# Patient Record
Sex: Male | Born: 1985 | Race: Black or African American | Hispanic: No | Marital: Single | Smoking: Current every day smoker
Health system: Southern US, Community
[De-identification: ages and names within clinical notes are randomized; demographics above are authoritative.]

## PROBLEM LIST (undated history)

## (undated) DIAGNOSIS — M654 Radial styloid tenosynovitis [de Quervain]: Secondary | ICD-10-CM

## (undated) HISTORY — DX: Radial styloid tenosynovitis (de quervain): M65.4

---
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
## 2005-04-08 ENCOUNTER — Emergency Department (HOSPITAL_COMMUNITY): Admission: EM | Admit: 2005-04-08 | Discharge: 2005-04-08 | Payer: Self-pay | Admitting: Emergency Medicine

## 2007-05-03 ENCOUNTER — Emergency Department (HOSPITAL_COMMUNITY): Admission: EM | Admit: 2007-05-03 | Discharge: 2007-05-03 | Payer: Self-pay | Admitting: Family Medicine

## 2007-11-02 HISTORY — PX: ORIF WRIST FRACTURE: SHX2133

## 2008-04-22 ENCOUNTER — Emergency Department (HOSPITAL_COMMUNITY): Admission: EM | Admit: 2008-04-22 | Discharge: 2008-04-22 | Payer: Self-pay | Admitting: Family Medicine

## 2008-04-24 ENCOUNTER — Emergency Department (HOSPITAL_COMMUNITY): Admission: EM | Admit: 2008-04-24 | Discharge: 2008-04-24 | Payer: Self-pay | Admitting: Family Medicine

## 2008-10-01 ENCOUNTER — Emergency Department (HOSPITAL_COMMUNITY): Admission: EM | Admit: 2008-10-01 | Discharge: 2008-10-01 | Payer: Self-pay | Admitting: Family Medicine

## 2009-01-28 ENCOUNTER — Emergency Department (HOSPITAL_COMMUNITY): Admission: EM | Admit: 2009-01-28 | Discharge: 2009-01-28 | Payer: Self-pay | Admitting: Family Medicine

## 2009-02-01 ENCOUNTER — Emergency Department (HOSPITAL_COMMUNITY): Admission: EM | Admit: 2009-02-01 | Discharge: 2009-02-02 | Payer: Self-pay | Admitting: Emergency Medicine

## 2009-02-04 ENCOUNTER — Encounter: Admission: RE | Admit: 2009-02-04 | Discharge: 2009-02-04 | Payer: Self-pay | Admitting: *Deleted

## 2009-02-05 ENCOUNTER — Ambulatory Visit (HOSPITAL_BASED_OUTPATIENT_CLINIC_OR_DEPARTMENT_OTHER): Admission: RE | Admit: 2009-02-05 | Discharge: 2009-02-05 | Payer: Self-pay | Admitting: *Deleted

## 2009-04-17 ENCOUNTER — Emergency Department (HOSPITAL_COMMUNITY): Admission: EM | Admit: 2009-04-17 | Discharge: 2009-04-18 | Payer: Self-pay | Admitting: Emergency Medicine

## 2009-11-18 ENCOUNTER — Emergency Department (HOSPITAL_COMMUNITY): Admission: EM | Admit: 2009-11-18 | Discharge: 2009-11-18 | Payer: Self-pay | Admitting: Emergency Medicine

## 2011-02-10 LAB — POCT HEMOGLOBIN-HEMACUE: Hemoglobin: 14.9 g/dL (ref 13.0–17.0)

## 2011-03-16 NOTE — Op Note (Signed)
NAME:  Maurice Welch, Maurice Welch                ACCOUNT NO.:  [id number]   MEDICAL RECORD NO.:  [id number]          PATIENT TYPE:  AMB   LOCATION:  DSC                          FACILITY:  [facility]   PHYSICIAN:  Tennis Must Meyerdierks, M.D.DATE OF BIRTH:  [birth year]   DATE OF PROCEDURE:  02/05/2009  DATE OF DISCHARGE:                               OPERATIVE REPORT   PREOPERATIVE DIAGNOSIS:  Fracture-dislocation, right fourth and fifth  carpometacarpal joints.   POSTOPERATIVE DIAGNOSIS:  Fracture-dislocation, right fourth and fifth  carpometacarpal joints.   PROCEDURE:  Closed reduction and percutaneous pinning, right fourth and  fifth carpometacarpal joints.   SURGEON:  Lowell Bouton, MD   ANESTHESIA:  General.   OPERATIVE FINDINGS:  The patient had an unstable fracture of the hamate  and also the fourth metacarpal base.  There was a 30% dorsal subluxation  of the fourth CMC joint and near-complete dislocation of the fifth CMC  joint.  A CT scan had identified this nicely.   PROCEDURE:  Under general anesthesia with a tourniquet on the right arm,  the right hand was prepped and draped in the usual fashion and after  elevating the limb, the tourniquet was inflated to 250 mmHg.  The fourth  and fifth CMC joints were reduced closed, and then a 0.045 K-wire was  passed across the fifth metacarpal into the carpus and then second one  across the fifth to the fourth metacarpal and into the carpus.  X-rays  showed good position and under fluoroscopy, there was no instability.  The pins were bent over and left protruding from the skin.  The patient  was placed in a dorsal protective splint.  He tolerated the procedure  well and went to the recovery room, awake in stable and good condition.      Lowell Bouton, M.D.  Electronically Signed     EMM/MEDQ  D:  02/05/2009  T:  02/06/2009  Job:  [id number]

## 2017-03-12 ENCOUNTER — Emergency Department (HOSPITAL_COMMUNITY)
Admission: EM | Admit: 2017-03-12 | Discharge: 2017-03-12 | Disposition: A | Discharge status: Home / Self Care | Payer: BLUE CROSS/BLUE SHIELD | Attending: Emergency Medicine | Admitting: Emergency Medicine

## 2017-03-12 ENCOUNTER — Emergency Department (HOSPITAL_COMMUNITY): Payer: BLUE CROSS/BLUE SHIELD

## 2017-03-12 ENCOUNTER — Encounter (HOSPITAL_COMMUNITY): Payer: Self-pay | Admitting: Emergency Medicine

## 2017-03-12 DIAGNOSIS — S6991XA Unspecified injury of right wrist, hand and finger(s), initial encounter: Secondary | ICD-10-CM | POA: Diagnosis not present

## 2017-03-12 DIAGNOSIS — Y999 Unspecified external cause status: Secondary | ICD-10-CM | POA: Diagnosis not present

## 2017-03-12 DIAGNOSIS — Y939 Activity, unspecified: Secondary | ICD-10-CM | POA: Insufficient documentation

## 2017-03-12 DIAGNOSIS — F17290 Nicotine dependence, other tobacco product, uncomplicated: Secondary | ICD-10-CM | POA: Diagnosis not present

## 2017-03-12 DIAGNOSIS — W231XXA Caught, crushed, jammed, or pinched between stationary objects, initial encounter: Secondary | ICD-10-CM | POA: Insufficient documentation

## 2017-03-12 DIAGNOSIS — Y929 Unspecified place or not applicable: Secondary | ICD-10-CM | POA: Insufficient documentation

## 2017-03-12 NOTE — ED Notes (Signed)
Pt verbalized understanding discharge instructions and denies any further needs or questions at this time. VS stable, ambulatory and steady gait.   

## 2017-03-12 NOTE — ED Triage Notes (Signed)
Reports jamming right thumb at work today.  Some swelling noted.

## 2017-03-12 NOTE — Discharge Instructions (Signed)
Wear splint on finger as directed. Take ibuprofen or naproxen/Aleve as needed for pain and inflammation. Schedule appointment with Ortho listed below for further evaluation. Return to ED for worsening pain, increased swelling, numbness, additional injury, fever.

## 2017-03-12 NOTE — ED Provider Notes (Signed)
[facility] Provider Note   CSN: [id number] Arrival date & time: 03/12/17  2214  By signing my name below, I, Doreatha Martin, attest that this documentation has been prepared under the direction and in the presence of Malgorzata Albert, PA-C. Electronically Signed: Doreatha Martin, ED Scribe. 03/12/17. 11:42 PM.    History   Chief Complaint Chief Complaint  Patient presents with  . right thumb injury    HPI Maurice Welch is a 31 y.o. male who presents to the Emergency Department complaining of gradually worsening right thumb pain and swelling that began a few days ago and worsened today. Pt states he believes he jammed the thumb a few days ago, causing his pain. He then reports overuse of it today. No known recent bites or wounds. He states his pain is worsened with thumb movement and alleviated with Aleve. He reports h/o fx and surgical repair 5-6 years ago to the right hand, but not to the right thumb. He denies fever, weakness, wrist pain, Numbness, additional complaints.   The history is provided by the patient. No language interpreter was used.    History reviewed. No pertinent past medical history.  There are no active problems to display for this patient.   History reviewed. No pertinent surgical history.     Home Medications    Prior to Admission medications   Not on File    Family History No family history on file.  Social History Social History  Substance Use Topics  . Smoking status: Current Every Day Smoker    Packs/day: 0.20    Years: 10.00    Types: Cigars  . Smokeless tobacco: Never Used  . Alcohol use Yes     Comment: occasionally     Allergies   Patient has no allergy information on record.   Review of Systems Review of Systems  Constitutional: Negative for fever.  Musculoskeletal: Positive for arthralgias and joint swelling.  Neurological: Negative for weakness.  All other systems reviewed and are negative.    Physical Exam Updated  Vital Signs BP 140/82 (BP Location: Right Arm)   Pulse 90   Temp 98.8 F (37.1 C) (Oral)   Resp 17   Ht 6\' 1"  (1.854 m)   Wt 86.2 kg   SpO2 99%   BMI 25.07 kg/m   Physical Exam  Constitutional: He appears well-developed and well-nourished. No distress.  HENT:  Head: Normocephalic and atraumatic.  Eyes: Conjunctivae and EOM are normal. No scleral icterus.  Neck: Normal range of motion.  Pulmonary/Chest: Effort normal. No respiratory distress.  Musculoskeletal:       Right hand: He exhibits swelling.       Hands: There is swelling in the indicated areas extending to the IP joint of the thumb. No redness or temperature change. Pain with movement of the IP joint. FROM at the MCP joint. Area is NVI.   Neurological: He is alert.  Skin: No rash noted. He is not diaphoretic.  Psychiatric: He has a normal mood and affect.  Nursing note and vitals reviewed.    ED Treatments / Results   DIAGNOSTIC STUDIES: Oxygen Saturation is 98% on RA, normal by my interpretation.    COORDINATION OF CARE: 11:41 PM Discussed treatment plan with pt at bedside which includes XR and pt agreed to plan.    Labs (all labs ordered are listed, but only abnormal results are displayed) Labs Reviewed - No data to display  EKG  EKG Interpretation None  Radiology Dg Finger Thumb Right  Result Date: 03/12/2017 CLINICAL DATA:  Painful right thumb for 2 days.  No injury. EXAM: RIGHT THUMB 2+V COMPARISON:  Right hand 02/01/2009 FINDINGS: Mild soft tissue swelling over the first carpometacarpal joint. The joint space is preserved and no bone erosion is demonstrated. No evidence of acute fracture or dislocation. IMPRESSION: Soft tissue swelling.  No acute bony abnormalities. Electronically Signed   By: Burman NievesWilliam  Stevens M.D.   On: 03/12/2017 23:33    Procedures Procedures (including critical care time)  Medications Ordered in ED Medications - No data to display   Initial Impression / Assessment  and Plan / ED Course  I have reviewed the triage vital signs and the nursing notes.  Pertinent labs & imaging results that were available during my care of the patient were reviewed by me and considered in my medical decision making (see chart for details).     Patient X-Ray negative for obvious fracture or dislocation. No signs of neurovascular compromise at this time. No concern for cellulitis as patient is afebrile, nontoxic appearing and appearance consistent with tendon or muscle injury. No site of injury or area of inoculation.  Pt advised to follow up with orthopedics. Patient given finger splint while in ED, and encouraged to take ibuprofen or Aleve as needed for pain and inflammation. Patient will be discharged home & is agreeable with above plan. Strict return precautions given.  Final Clinical Impressions(s) / ED Diagnoses   Final diagnoses:  Thumb injury, right, initial encounter    New Prescriptions There are no discharge medications for this patient.  I personally performed the services described in this documentation, which was scribed in my presence. The recorded information has been reviewed and is accurate.    Dietrich PatesKhatri, Dennise Bamber, PA-C 03/13/17 0051    Lorre NickAllen, Anthony, MD 03/13/17 [id number]

## 2017-03-12 NOTE — ED Notes (Signed)
Pt transported to xray 

## 2020-07-10 ENCOUNTER — Other Ambulatory Visit: Payer: Self-pay | Admitting: Family Medicine

## 2020-07-10 ENCOUNTER — Other Ambulatory Visit: Payer: Self-pay

## 2020-07-10 ENCOUNTER — Ambulatory Visit: Payer: Self-pay

## 2020-07-10 DIAGNOSIS — Z0000 Encounter for general adult medical examination without abnormal findings: Secondary | ICD-10-CM

## 2021-06-16 ENCOUNTER — Ambulatory Visit (HOSPITAL_COMMUNITY)
Admission: EM | Admit: 2021-06-16 | Discharge: 2021-06-16 | Disposition: A | Discharge status: Home / Self Care | Payer: 59 | Attending: Emergency Medicine | Admitting: Emergency Medicine

## 2021-06-16 ENCOUNTER — Other Ambulatory Visit: Payer: Self-pay

## 2021-06-16 ENCOUNTER — Encounter (HOSPITAL_COMMUNITY): Payer: Self-pay

## 2021-06-16 DIAGNOSIS — L03116 Cellulitis of left lower limb: Secondary | ICD-10-CM | POA: Diagnosis not present

## 2021-06-16 DIAGNOSIS — R200 Anesthesia of skin: Secondary | ICD-10-CM | POA: Diagnosis not present

## 2021-06-16 MED ORDER — DOXYCYCLINE HYCLATE 100 MG PO CAPS: 100.0000 mg | ORAL_CAPSULE | Freq: Two times a day (BID) | ORAL | 0 refills | Status: DC

## 2021-06-16 MED ORDER — PREDNISONE 20 MG PO TABS: 40.0000 mg | ORAL_TABLET | Freq: Every day | ORAL | 0 refills | Status: AC

## 2021-06-16 NOTE — Discharge Instructions (Addendum)
Take the doxycycline twice a day for the next 10 days.   Take the prednisone daily for the next 5 days.    You can take Tylenol and/or Ibuprofen as needed for pain relief and fever reduction.   You can soak your foot in warm water or epsom salt soak for comfort.   Follow up with podiatry ([facility]) for re-evaluation.  Return or go to the Emergency Department if symptoms worsen or do not improve in the next few days.

## 2021-06-16 NOTE — ED Provider Notes (Signed)
[facility]    CSN: [id number] Arrival date & time: 06/16/21  1219      History   Chief Complaint Chief Complaint  Patient presents with   Numbness    HPI Maurice Welch is a 35 y.o. male.   Patient here for evaluation of left toe numbness and swelling.  Reports numbness has been intermittent for the past several weeks.  Reports noticed some swelling and redness in left last night.  Denies any pain in feet, legs, or back.  Has not taken any OTC medication or treatment.  Denies any trauma, injury, or other precipitating event.  Denies any specific alleviating or aggravating factors.  Denies any fevers, chest pain, shortness of breath, N/V/D, weakness, abdominal pain, or headaches.    The history is provided by the patient.   No past medical history on file.  There are no problems to display for this patient.   No past surgical history on file.     Home Medications    Prior to Admission medications   Medication Sig Start Date End Date Taking? Authorizing Provider  doxycycline (VIBRAMYCIN) 100 MG capsule Take 1 capsule (100 mg total) by mouth 2 (two) times daily. 06/16/21  Yes Ivette Loyal, NP  predniSONE (DELTASONE) 20 MG tablet Take 2 tablets (40 mg total) by mouth daily for 5 days. 06/16/21 06/21/21 Yes Ivette Loyal, NP    Family History No family history on file.  Social History Social History   Tobacco Use   Smoking status: Every Day    Packs/day: 0.20    Years: 10.00    Pack years: 2.00    Types: Cigars, Cigarettes   Smokeless tobacco: Never  Vaping Use   Vaping Use: Never used  Substance Use Topics   Alcohol use: Yes    Comment: occasionally   Drug use: No     Allergies   Tylenol [acetaminophen] and Watermelon [citrullus vulgaris]   Review of Systems Review of Systems  Musculoskeletal:  Positive for joint swelling.  Neurological:  Positive for numbness.  All other systems reviewed and are negative.   Physical Exam Triage  Vital Signs ED Triage Vitals  Enc Vitals Group     BP 06/16/21 1313 128/87     Pulse Rate 06/16/21 1313 77     Resp 06/16/21 1313 18     Temp 06/16/21 1313 98.4 F (36.9 C)     Temp Source 06/16/21 1313 Oral     SpO2 06/16/21 1313 99 %     Weight --      Height --      Head Circumference --      Peak Flow --      Pain Score 06/16/21 1309 8     Pain Loc --      Pain Edu? --      Excl. in [place]? --    No data found.  Updated Vital Signs BP 128/87 (BP Location: Left Arm)   Pulse 77   Temp 98.4 F (36.9 C) (Oral)   Resp 18   SpO2 99%   Visual Acuity Right Eye Distance:   Left Eye Distance:   Bilateral Distance:    Right Eye Near:   Left Eye Near:    Bilateral Near:     Physical Exam Vitals and nursing note reviewed.  Constitutional:      General: He is not in acute distress.    Appearance: Normal appearance. He is not ill-appearing, toxic-appearing or diaphoretic.  HENT:     Head: Normocephalic and atraumatic.  Eyes:     Conjunctiva/sclera: Conjunctivae normal.  Cardiovascular:     Rate and Rhythm: Normal rate.     Pulses: Normal pulses.  Pulmonary:     Effort: Pulmonary effort is normal.  Abdominal:     General: Abdomen is flat.  Musculoskeletal:        General: Normal range of motion.     Cervical back: Normal range of motion.  Feet:     Right foot:     Skin integrity: Skin integrity normal.     Toenail Condition: Right toenails are normal.     Left foot:     Skin integrity: Erythema and warmth present.     Toenail Condition: Left toenails are normal.     Comments: Redness and swelling to left toes and top of foot, see photo below Skin:    General: Skin is warm and dry.  Neurological:     General: No focal deficit present.     Mental Status: He is alert and oriented to person, place, and time.  Psychiatric:        Mood and Affect: Mood normal.      UC Treatments / Results  Labs (all labs ordered are listed, but only abnormal results are  displayed) Labs Reviewed - No data to display  EKG   Radiology No results found.  Procedures Procedures (including critical care time)  Medications Ordered in UC Medications - No data to display  Initial Impression / Assessment and Plan / UC Course  I have reviewed the triage vital signs and the nursing notes.  Pertinent labs & imaging results that were available during my care of the patient were reviewed by me and considered in my medical decision making (see chart for details).    Assessment negative for red flags or concerns. Possible cellulitis.  Will treat with doxycycline and prednisone.  Tylenol and/or Ibuprofen as needed.  Soak foot in warm water or epsom salt.  Follow up with podiatry for re-evaluation.  Return or go to the ED for any worsening symptoms.   Final Clinical Impressions(s) / UC Diagnoses   Final diagnoses:  Cellulitis of left lower extremity  Numbness     Discharge Instructions      Take the doxycycline twice a day for the next 10 days.   Take the prednisone daily for the next 5 days.    You can take Tylenol and/or Ibuprofen as needed for pain relief and fever reduction.   You can soak your foot in warm water or epsom salt soak for comfort.   Follow up with podiatry ([facility]) for re-evaluation.  Return or go to the Emergency Department if symptoms worsen or do not improve in the next few days.      ED Prescriptions     Medication Sig Dispense Auth. Provider   doxycycline (VIBRAMYCIN) 100 MG capsule Take 1 capsule (100 mg total) by mouth 2 (two) times daily. 20 capsule Ivette Loyal, NP   predniSONE (DELTASONE) 20 MG tablet Take 2 tablets (40 mg total) by mouth daily for 5 days. 10 tablet Ivette Loyal, NP      PDMP not reviewed this encounter.   Ivette Loyal, NP 06/16/21 1357

## 2021-06-16 NOTE — ED Triage Notes (Signed)
Pt states for a week he has been having some swelling, numbness/ tingling in his toes. Pt states his toes are normal in color.

## 2021-06-18 ENCOUNTER — Ambulatory Visit (INDEPENDENT_AMBULATORY_CARE_PROVIDER_SITE_OTHER): Payer: 59

## 2021-06-18 ENCOUNTER — Ambulatory Visit (INDEPENDENT_AMBULATORY_CARE_PROVIDER_SITE_OTHER): Payer: 59 | Admitting: Podiatry

## 2021-06-18 ENCOUNTER — Other Ambulatory Visit: Payer: Self-pay

## 2021-06-18 DIAGNOSIS — M778 Other enthesopathies, not elsewhere classified: Secondary | ICD-10-CM

## 2021-06-18 DIAGNOSIS — L02619 Cutaneous abscess of unspecified foot: Secondary | ICD-10-CM | POA: Diagnosis not present

## 2021-06-18 DIAGNOSIS — M779 Enthesopathy, unspecified: Secondary | ICD-10-CM | POA: Diagnosis not present

## 2021-06-18 DIAGNOSIS — R200 Anesthesia of skin: Secondary | ICD-10-CM

## 2021-06-18 DIAGNOSIS — L03119 Cellulitis of unspecified part of limb: Secondary | ICD-10-CM

## 2021-06-18 MED ORDER — DICLOFENAC SODIUM 75 MG PO TBEC: 75.0000 mg | DELAYED_RELEASE_TABLET | Freq: Two times a day (BID) | ORAL | 2 refills | Status: DC

## 2021-06-18 NOTE — Progress Notes (Signed)
Subjective:   Patient ID: Maurice Welch, male   DOB: 35 y.o.   MRN: [id number]   HPI Patient presents stating he has had some numbness in his feet for a couple weeks and on his left he had swelling of his forefoot and was placed on an antibiotic and steroid which seems to be helping but he still gets some swelling some numbness but did not see an injury.  Patient smokes   Review of Systems  All other systems reviewed and are negative.      Objective:  Physical Exam Vitals and nursing note reviewed.  Constitutional:      Appearance: He is well-developed.  Pulmonary:     Effort: Pulmonary effort is normal.  Musculoskeletal:        General: Normal range of motion.  Skin:    General: Skin is warm.  Neurological:     Mental Status: He is alert.  Neurovascular status found to be intact muscle strength was found be adequate range of motion was found to be within normal limits with patient found to have mild swelling of the dorsum of the left foot and on the lateral side of the third digit left I did note some slight irritation of the tissue with no active drainage or open area but what may have been a previous portal.  Did not have any change in sharp dull or vibratory      Assessment:  Probability for low-grade infection of the left foot which appears to be resolving now with patient on doxycycline and also steroid that does not seem to be a problem with neurological that I do not think is an issue but if so we will have to see a neurologist     Plan:  H&P x-rays reviewed conditions discussed and I Minna add in diclofenac when he finishes the prednisone.  Continue the doxycycline if any swelling any redness any drainage were to occur he is to contact us immediately  X-rays indicate no signs of osteo no signs of bony lysis or other pathology bilateral with moderate flattening of the arch

## 2021-06-19 ENCOUNTER — Other Ambulatory Visit: Payer: Self-pay | Admitting: Podiatry

## 2021-06-19 DIAGNOSIS — M779 Enthesopathy, unspecified: Secondary | ICD-10-CM

## 2022-01-14 ENCOUNTER — Ambulatory Visit (HOSPITAL_COMMUNITY)
Admission: EM | Admit: 2022-01-14 | Discharge: 2022-01-14 | Disposition: A | Discharge status: Home / Self Care | Payer: 59 | Attending: Physician Assistant | Admitting: Physician Assistant

## 2022-01-14 ENCOUNTER — Other Ambulatory Visit: Payer: Self-pay

## 2022-01-14 ENCOUNTER — Encounter (HOSPITAL_COMMUNITY): Payer: Self-pay

## 2022-01-14 DIAGNOSIS — M79644 Pain in right finger(s): Secondary | ICD-10-CM

## 2022-01-14 DIAGNOSIS — M654 Radial styloid tenosynovitis [de Quervain]: Secondary | ICD-10-CM | POA: Diagnosis not present

## 2022-01-14 MED ORDER — PREDNISONE 10 MG (21) PO TBPK: ORAL_TABLET | ORAL | 0 refills | Status: DC

## 2022-01-14 NOTE — ED Provider Notes (Signed)
?[facility] ? ? ? ?CSN: [id number] ?Arrival date & time: 01/14/22  [id number] ? ? ?  ? ?History   ?Chief Complaint ?Chief Complaint  ?Patient presents with  ? Hand Pain  ? ? ?HPI ?Maurice Welch is a 36 y.o. male.  ? ?Patient presents today with a 1 week history of persistent and worsening right thumb and wrist pain.  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  Soon after work symptoms began.  He is right-handed.  He reports some numbness in his thumb and first 2 fingers particularly when sleeping at night.  Reports pain with movement of thumb that travels into radial right wrist.  He does report having a surgery on his fourth and fifth metacarpal many years ago but denies any more recent surgeries.  He has been taking Aleve without improvement of symptoms.  Reports pain is rated 8 on a 0-10 pain scale, described as aching with periodic sharp pains, no aggravating or alleviating factors identified.  Denies episodes of similar symptoms in the past or history of carpal tunnel. ? ? ?History reviewed. No pertinent past medical history. ? ?There are no problems to display for this patient. ? ? ?History reviewed. No pertinent surgical history. ? ? ? ? ?Home Medications   ? ?Prior to Admission medications   ?Medication Sig Start Date End Date Taking? Authorizing Provider  ?predniSONE (STERAPRED UNI-PAK 21 TAB) 10 MG (21) TBPK tablet As directed 01/14/22  Yes Ranier Coach K, PA-C  ? ? ?Family History ?History reviewed. No pertinent family history. ? ?Social History ?Social History  ? ?Tobacco Use  ? Smoking status: Every Day  ?  Packs/day: 0.20  ?  Years: 10.00  ?  Pack years: 2.00  ?  Types: Cigars, Cigarettes  ? Smokeless tobacco: Never  ?Vaping Use  ? Vaping Use: Never used  ?Substance Use Topics  ? Alcohol use: Yes  ?  Comment: occasionally  ? Drug use: No  ? ? ? ?Allergies   ?Tylenol [acetaminophen] and Watermelon [citrullus  vulgaris] ? ? ?Review of Systems ?Review of Systems  ?Constitutional:  Positive for activity change. Negative for appetite change, fatigue and fever.  ?Musculoskeletal:  Positive for arthralgias and myalgias. Negative for joint swelling.  ?Neurological:  Positive for numbness. Negative for dizziness, weakness, light-headedness and headaches.  ? ? ?Physical Exam ?Triage Vital Signs ?ED Triage Vitals  ?Enc Vitals Group  ?   BP 01/14/22 0918 120/68  ?   Pulse Rate 01/14/22 0918 78  ?   Resp 01/14/22 0918 18  ?   Temp 01/14/22 0918 98.2 ?F (36.8 ?C)  ?   Temp Source 01/14/22 0918 Oral  ?   SpO2 01/14/22 0918 99 %  ?   Weight --   ?   Height --   ?   Head Circumference --   ?   Peak Flow --   ?   Pain Score 01/14/22 0905 8  ?   Pain Loc --   ?   Pain Edu? --   ?   Excl. in [place]? --   ? ?No data found. ? ?Updated Vital Signs ?BP 120/68 (BP Location: Left Arm)   Pulse 78   Temp 98.2 ?F (36.8 ?C) (Oral)   Resp 18   SpO2 99%  ? ?Visual Acuity ?Right Eye Distance:   ?Left Eye Distance:   ?Bilateral Distance:   ? ?  Right Eye Near:   ?Left Eye Near:    ?Bilateral Near:    ? ?Physical Exam ?Vitals reviewed.  ?Constitutional:   ?   General: He is awake.  ?   Appearance: Normal appearance. He is well-developed. He is not ill-appearing.  ?   Comments: Very pleasant male appears stated age in no acute distress sitting comfortably in exam room  ?HENT:  ?   Head: Normocephalic and atraumatic.  ?Cardiovascular:  ?   Rate and Rhythm: Normal rate and regular rhythm.  ?   Heart sounds: Normal heart sounds, S1 normal and S2 normal. No murmur heard. ?Pulmonary:  ?   Effort: Pulmonary effort is normal.  ?   Breath sounds: Normal breath sounds. No stridor. No wheezing, rhonchi or rales.  ?   Comments: Clear to auscultation bilaterally ?Musculoskeletal:  ?   Right wrist: Tenderness present. No swelling, bony tenderness or snuff box tenderness. Decreased range of motion.  ?   Right hand: Tenderness present. No swelling or bony tenderness.  Decreased range of motion. There is no disruption of two-point discrimination. Normal capillary refill.  ?   Comments: Mild tenderness palpation over the radial right wrist but no snuffbox tenderness.  Pain with movement of right thumb.  No deformity or bony tenderness noted.  Hand neurovascularly intact.  Positive Finkelstein.  Negative Phalen's but positive Tinel's.  ?Neurological:  ?   Mental Status: He is alert.  ?Psychiatric:     ?   Behavior: Behavior is cooperative.  ? ? ? ?UC Treatments / Results  ?Labs ?(all labs ordered are listed, but only abnormal results are displayed) ?Labs Reviewed - No data to display ? ?EKG ? ? ?Radiology ?No results found. ? ?Procedures ?Procedures (including critical care time) ? ?Medications Ordered in UC ?Medications - No data to display ? ?Initial Impression / Assessment and Plan / UC Course  ?I have reviewed the triage vital signs and the nursing notes. ? ?Pertinent labs & imaging results that were available during my care of the patient were reviewed by me and considered in my medical decision making (see chart for details). ? ?  ? ?X-ray was deferred as patient has no bony tenderness and denies any recent trauma or injury.  Concern for de Quervain's tenosynovitis with beginning carpal tunnel.  He has failed NSAID therapy so we will try prednisone taper.  Discussed that he is not to take NSAIDs with this medication due to risk of GI bleeding.  He was given a thumb spica splint and encouraged to use this is much as possible particularly at night.  Recommended conservative treatment measures including RICE protocol.  He was provided work excuse note.  Discussed that if symptoms or not improving he should follow-up with orthopedic provider and was given contact information for local provider and encouraged to call to schedule an appointment.  Discussed alarm symptoms that warrant emergent evaluation including increased pain, swelling, difficulty moving, persistent numbness or  tingling sensation.  Strict return precautions given to which he expressed understanding ? ?Final Clinical Impressions(s) / UC Diagnoses  ? ?Final diagnoses:  ?De Quervain's tenosynovitis, right  ?Pain of right thumb  ? ? ? ?Discharge Instructions   ? ?  ?I am concerned that you are developing de Quervain's tenosynovitis with carpal tunnel.  Please take prednisone taper as prescribed.  Do not take NSAIDs including aspirin, ibuprofen/Advil, naproxen/Aleve with this medication as it will cause stomach bleeding.  Use ice, rest, elevation for symptom relief.  Wear  wrist splint as much as possible particularly at night.  If symptoms do not improving quickly please follow-up with orthopedics; call to schedule appointment.  If anything worsens please return for reevaluation. ? ? ? ? ?ED Prescriptions   ? ? Medication Sig Dispense Auth. Provider  ? predniSONE (STERAPRED UNI-PAK 21 TAB) 10 MG (21) TBPK tablet As directed 21 tablet Jamye Balicki K, PA-C  ? ?  ? ?PDMP not reviewed this encounter. ?  ?Terrilee Croak, PA-C ?01/14/22 [id number] ? ?

## 2022-01-14 NOTE — Discharge Instructions (Signed)
I am concerned that you are developing de Quervain's tenosynovitis with carpal tunnel.  Please take prednisone taper as prescribed.  Do not take NSAIDs including aspirin, ibuprofen/Advil, naproxen/Aleve with this medication as it will cause stomach bleeding.  Use ice, rest, elevation for symptom relief.  Wear wrist splint as much as possible particularly at night.  If symptoms do not improving quickly please follow-up with orthopedics; call to schedule appointment.  If anything worsens please return for reevaluation. ?

## 2022-01-14 NOTE — ED Triage Notes (Signed)
Pt present right thumb/hand pain. Pt states he is having pain from the thumb to the middle of his hand/wrist. Symptom started a week ago.  ?

## 2022-01-20 ENCOUNTER — Other Ambulatory Visit: Payer: Self-pay

## 2022-01-20 ENCOUNTER — Ambulatory Visit (HOSPITAL_COMMUNITY)
Admission: EM | Admit: 2022-01-20 | Discharge: 2022-01-20 | Disposition: A | Discharge status: Home / Self Care | Payer: 59

## 2022-01-20 ENCOUNTER — Encounter (HOSPITAL_COMMUNITY): Payer: Self-pay | Admitting: Emergency Medicine

## 2022-01-20 DIAGNOSIS — M654 Radial styloid tenosynovitis [de Quervain]: Secondary | ICD-10-CM | POA: Diagnosis not present

## 2022-01-20 NOTE — ED Triage Notes (Signed)
Pt reports he was seen here on 3/16 for right hand pain. Pt states he was sent to a specialist and the specialist was unable to see him until 3/30. Pt states he was written out of work until 3/19 but needs a note stating limitations or work note taking him out of work longer.  ?

## 2022-01-20 NOTE — ED Provider Notes (Signed)
?[facility] ? ? ? ?CSN: [id number] ?Arrival date & time: 01/20/22  1149 ? ? ?  ? ?History   ?Chief Complaint ?Chief Complaint  ?Patient presents with  ? Hand Pain  ? Letter for School/Work  ? ? ?HPI ?Maurice Welch is a 36 y.o. male.  ? ?The patient is a 36 year old male who returns for continued complaints of right hand pain.  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  Patient states he was seen on 01/14/2022 here in our clinic, and diagnosed with with de Quervain's tenosynovitis.  The was giving a wrist brace for the right hand/arm.  Patient states he was taken out of work until 3/19.  At that time, patient was also referred to a hand specialist.  He did get an appointment for the hand specialist, but not until 3/30.  Patient states he returned to work, but his employer requested a note with continued restrictions because he was continuing to have right hand pain.  The patient was prescribed steroids on 3/16, but he reports he had to stop taking them because he began itching.  The patient continues to experience pain in the right thumb that radiates down into the radial aspect of the right wrist.  He also complains of numbness and tingling in the right thumb.  He does have decreased range of motion in the right thumb and wrist.  States he has not been taking any medication for his symptoms at this time. ? ?The history is provided by the patient.  ? ?History reviewed. No pertinent past medical history. ? ?There are no problems to display for this patient. ? ? ?History reviewed. No pertinent surgical history. ? ? ? ? ?Home Medications   ? ?Prior to Admission medications   ?Medication Sig Start Date End Date Taking? Authorizing Provider  ?predniSONE (STERAPRED UNI-PAK 21 TAB) 10 MG (21) TBPK tablet As directed 01/14/22   Raspet, Noberto Retort, PA-C  ? ? ?Family History ?History reviewed. No pertinent family history. ? ?Social History ?Social History  ? ?Tobacco Use  ? Smoking status: Every Day  ?   Packs/day: 0.20  ?  Years: 10.00  ?  Pack years: 2.00  ?  Types: Cigars, Cigarettes  ? Smokeless tobacco: Never  ?Vaping Use  ? Vaping Use: Never used  ?Substance Use Topics  ? Alcohol use: Yes  ?  Comment: occasionally  ? Drug use: No  ? ? ? ?Allergies   ?Tylenol [acetaminophen] and Watermelon [citrullus vulgaris] ? ? ?Review of Systems ?Review of Systems  ?Constitutional: Negative.   ?Musculoskeletal:   ?     Right thumb and right wrist pain  ?Skin: Negative.   ?Psychiatric/Behavioral: Negative.    ? ? ?Physical Exam ?Triage Vital Signs ?ED Triage Vitals  ?Enc Vitals Group  ?   BP 01/20/22 1349 134/70  ?   Pulse Rate 01/20/22 1349 76  ?   Resp 01/20/22 1349 18  ?   Temp 01/20/22 1349 98.1 ?F (36.7 ?C)  ?   Temp Source 01/20/22 1349 Oral  ?   SpO2 01/20/22 1349 100 %  ?   Weight 01/20/22 1346 190 lb 0.6 oz (86.2 kg)  ?   Height 01/20/22 1346 6\' 1"  (1.854 m)  ?   Head Circumference --   ?   Peak Flow --   ?   Pain Score 01/20/22 1346 8  ?   Pain Loc --   ?   Pain Edu? --   ?  Excl. in GC? --   ? ?No data found. ? ?Updated Vital Signs ?BP 134/70 (BP Location: Left Arm)   Pulse 76   Temp 98.1 ?F (36.7 ?C) (Oral)   Resp 18   Ht 6\' 1"  (1.854 m)   Wt 190 lb 0.6 oz (86.2 kg)   SpO2 100%   BMI 25.07 kg/m?  ? ?Visual Acuity ?Right Eye Distance:   ?Left Eye Distance:   ?Bilateral Distance:   ? ?Right Eye Near:   ?Left Eye Near:    ?Bilateral Near:    ? ?Physical Exam ?Vitals reviewed.  ?Constitutional:   ?   General: He is not in acute distress. ?   Appearance: Normal appearance. He is normal weight.  ?HENT:  ?   Head: Normocephalic and atraumatic.  ?Musculoskeletal:  ?   Right wrist: Tenderness present. No swelling, deformity or crepitus. Decreased range of motion. Normal pulse.  ?   Left wrist: Normal.  ?   Right hand: No swelling or deformity. Decreased range of motion (right thumb). Normal capillary refill. Normal pulse.  ?   Left hand: Normal.  ?Neurological:  ?   Mental Status: He is alert.  ? ? ? ?UC  Treatments / Results  ?Labs ?(all labs ordered are listed, but only abnormal results are displayed) ?Labs Reviewed - No data to display ? ?EKG ? ? ?Radiology ?No results found. ? ?Procedures ?Procedures (including critical care time) ? ?Medications Ordered in UC ?Medications - No data to display ? ?Initial Impression / Assessment and Plan / UC Course  ?I have reviewed the triage vital signs and the nursing notes. ? ?Pertinent labs & imaging results that were available during my care of the patient were reviewed by me and considered in my medical decision making (see chart for details). ? ?The patient is a 35 year old male who presents for continued right thumb/wrist pain.  Patient was seen on 3/16 and diagnosed with de Quervain's tenosynovitis.  Patient was given steroids at that time for his symptoms.  Patient has been unable to take the steroids due to an intolerance to the medication.  He was referred to a hand specialist, and his appointment is scheduled for 3/30.  Patient returns another work note with restrictions are taking him out until he can see the specialist.  On exam, patient continues to experience symptoms of tenderness, weakness, and decreased range of motion in the right thumb and right wrist.  He has pain on palpation, he also complains of numbness.  We will provide the patient with work restrictions until he can be seen by the specialist on 3/30.  Explained to the patient that restrictions will be provided; however, it will be up to his employer to take him out of work if they cannot make those restrictions.  Patient encouraged to take arthritis strength Tylenol for his symptoms.  Patient states he can take Aleve without any reaction.  Patient to follow-up with his hand specialist on 3/30. ?Final Clinical Impressions(s) / UC Diagnoses  ? ?Final diagnoses:  ?None  ? ?Discharge Instructions   ?None ?  ? ?ED Prescriptions   ?None ?  ? ?PDMP not reviewed this encounter. ?  ?4/30,  NP ?01/20/22 1411 ? ?

## 2022-01-20 NOTE — Discharge Instructions (Addendum)
Continue to wear your brace and to you are seen by the hand specialist. ?Work restrictions were provided, instructions included no use of the right hand until seen by the hand specialist. ?Please note if your employer cannot meet her restrictions, your employer will be responsible for taking you out of work. ?May take arthritis strength Tylenol or Aleve (if able to tolerate) for pain. ?Follow-up with a hand specialist on 01/28/2022. ?

## 2022-01-28 ENCOUNTER — Encounter: Payer: Self-pay | Admitting: Orthopedic Surgery

## 2022-01-28 ENCOUNTER — Ambulatory Visit (INDEPENDENT_AMBULATORY_CARE_PROVIDER_SITE_OTHER): Payer: 59 | Admitting: Orthopedic Surgery

## 2022-01-28 ENCOUNTER — Ambulatory Visit: Payer: Self-pay

## 2022-01-28 ENCOUNTER — Telehealth: Payer: Self-pay

## 2022-01-28 DIAGNOSIS — M654 Radial styloid tenosynovitis [de Quervain]: Secondary | ICD-10-CM | POA: Insufficient documentation

## 2022-01-28 DIAGNOSIS — M79641 Pain in right hand: Secondary | ICD-10-CM | POA: Diagnosis not present

## 2022-01-28 MED ORDER — LIDOCAINE HCL 1 % IJ SOLN
1.0000 mL | INTRAMUSCULAR | Status: AC | PRN
  Administered 2022-01-28: 1 mL

## 2022-01-28 MED ORDER — BETAMETHASONE SOD PHOS & ACET 6 (3-3) MG/ML IJ SUSP
6.0000 mg | INTRAMUSCULAR | Status: AC | PRN
  Administered 2022-01-28: 6 mg via INTRA_ARTICULAR

## 2022-01-28 NOTE — Progress Notes (Signed)
? ?Office Visit Note ?  ?Patient: Maurice Welch           ?Date of Birth: [birth year]           ?MRN: [id number] ?Visit Date: 01/28/2022 ?             ?Requested by: No referring provider defined for this encounter. ?PCP: Patient, No Pcp Per (Inactive) ? ? ?Assessment & Plan: ?Visit Diagnoses:  ?1. Pain in right hand   ?2. De Quervain's tenosynovitis, right   ? ? ?Plan: We discussed the diagnosis, prognosis, non-operative and operative treatment options for De Quervain's tenosynovitis. ? ?After our discussion, the patient would like to proceed with corticosteroid injection into the first dorsal compartment.  We reviewed the risks and benefits of conservative management.  The patient expressed understanding of the reasoning and strategy going forward. ? ?All patient questions and concerns were addressed.  ? ?I can see him back in 6-8 weeks if he is still symptomatic.  ? ? ?Follow-Up Instructions: No follow-ups on file.  ? ?Orders:  ?Orders Placed This Encounter  ?Procedures  ? XR Hand Complete Right  ? ?No orders of the defined types were placed in this encounter. ? ? ? ? Procedures: ?Hand/UE Inj: R extensor compartment 1 for de Quervain's tenosynovitis on 01/28/2022 10:28 AM ?Indications: pain and tendon swelling ?Details: 25 G needle, radial approach ?Medications: 1 mL lidocaine 1 %; 6 mg betamethasone acetate-betamethasone sodium phosphate 6 (3-3) MG/ML ?Procedure, treatment alternatives, risks and benefits explained, specific risks discussed. Consent was given by the patient. Immediately prior to procedure a time out was called to verify the correct patient, procedure, equipment, support staff and site/side marked as required. Patient was prepped and draped in the usual sterile fashion.  ? ? ? ? ?Clinical Data: ?No additional findings. ? ? ?Subjective: ?Chief Complaint  ?Patient presents with  ? Right Hand - New Patient (Initial Visit)  ? ? 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  Is been going on since 01/09/2022.  He notes that 1 day after work he felt pain at the radial aspect of the wrist on the radial styloid.  It has subsequently worsened.  He was seen in urgent care and was given prednisone taper which he finished.  This helped some as he now has improved range of motion from previous.  His pain is still present at the right aspect of the wrist and worse with certain activities that involve ulnar deviation or range of motion of the thumb. ? ? ?Review of Systems ? ? ?Objective: ?Vital Signs: There were no vitals taken for this visit. ? ?Physical Exam ?Constitutional:   ?   Appearance: Normal appearance.  ?Cardiovascular:  ?   Rate and Rhythm: Normal rate.  ?   Pulses: Normal pulses.  ?Pulmonary:  ?   Effort: Pulmonary effort is normal.  ?Skin: ?   General: Skin is warm and dry.  ?   Capillary Refill: Capillary refill takes less than 2 seconds.  ?Neurological:  ?   Mental Status: He is alert.  ? ? ?Right Hand Exam  ? ?Tenderness  ?Right hand tenderness location: TTP at radial styloid with mild to moderate swelling. ? ?Range of Motion  ?The patient has normal right wrist ROM.  ? ?Other  ?Erythema: absent ?Sensation: normal ?Pulse: present ? ?Comments:  ++ Finkelstein test.  No pain w/ [facility] grind test.  No thumb triggering.  ? ? ? ? ?  Specialty Comments:  ?No specialty comments available. ? ?Imaging: ?No results found. ? ? ?PMFS History: ?Patient Active Problem List  ? Diagnosis Date Noted  ? De Quervain's tenosynovitis, right 01/28/2022  ? ?History reviewed. No pertinent past medical history.  ?History reviewed. No pertinent family history.  ?History reviewed. No pertinent surgical history. ?Social History  ? ?Occupational History  ? Not on file  ?Tobacco Use  ? Smoking status: Every Day  ?  Packs/day: 0.20  ?  Years: 10.00  ?  Pack years: 2.00  ?  Types: Cigars, Cigarettes  ? Smokeless tobacco: Never  ?Vaping Use  ? Vaping Use: Never used   ?Substance and Sexual Activity  ? Alcohol use: Yes  ?  Comment: occasionally  ? Drug use: No  ? Sexual activity: Yes  ? ? ? ? ? ? ?

## 2022-01-28 NOTE — Telephone Encounter (Signed)
Patient would like a return to work note emailed to him at [email or web address].  Please advise.  Thank you. ?

## 2022-01-29 NOTE — Telephone Encounter (Signed)
The pt is back on the phone --he needs a note from Yesterdays visit 01-28-22, to be released to go back to work on 01-29-22. ? ?He needs this released to Mychart ? ?Thanks  ?

## 2022-01-29 NOTE — Telephone Encounter (Signed)
Contacted patient and he states that he will set up mychart to have letter posted. ?

## 2022-01-29 NOTE — Telephone Encounter (Signed)
Can you please send a corrected letter with the correct dates  to the pt. ? ?I have spoke with the pt and his manager, and assured them I would send a corrected letter out. ?

## 2022-01-29 NOTE — Telephone Encounter (Signed)
Pt states that he needs a note for work . ? ?There is one in the letters--but its from Urgent Care--and the dates are wrong  ? ?Please call the pt [phone] ? ?This letter should Emailed --[email or web address] ? ?Pt needs this ASAP ? ? ?

## 2022-01-29 NOTE — Telephone Encounter (Signed)
Attempted to contact patient however had to leave a voicemail informing patient that note has been posted to his mychart. ?

## 2022-01-29 NOTE — Telephone Encounter (Signed)
Letter is in patient's chart and a copy has been printed out. ?

## 2022-05-07 IMAGING — DX DG CHEST 1V
1 series · 1 of 1 positions shown · non-contrast
Comparison: None.

CLINICAL DATA: Tobacco use

EXAM:
CHEST  1 VIEW

[chest pa]
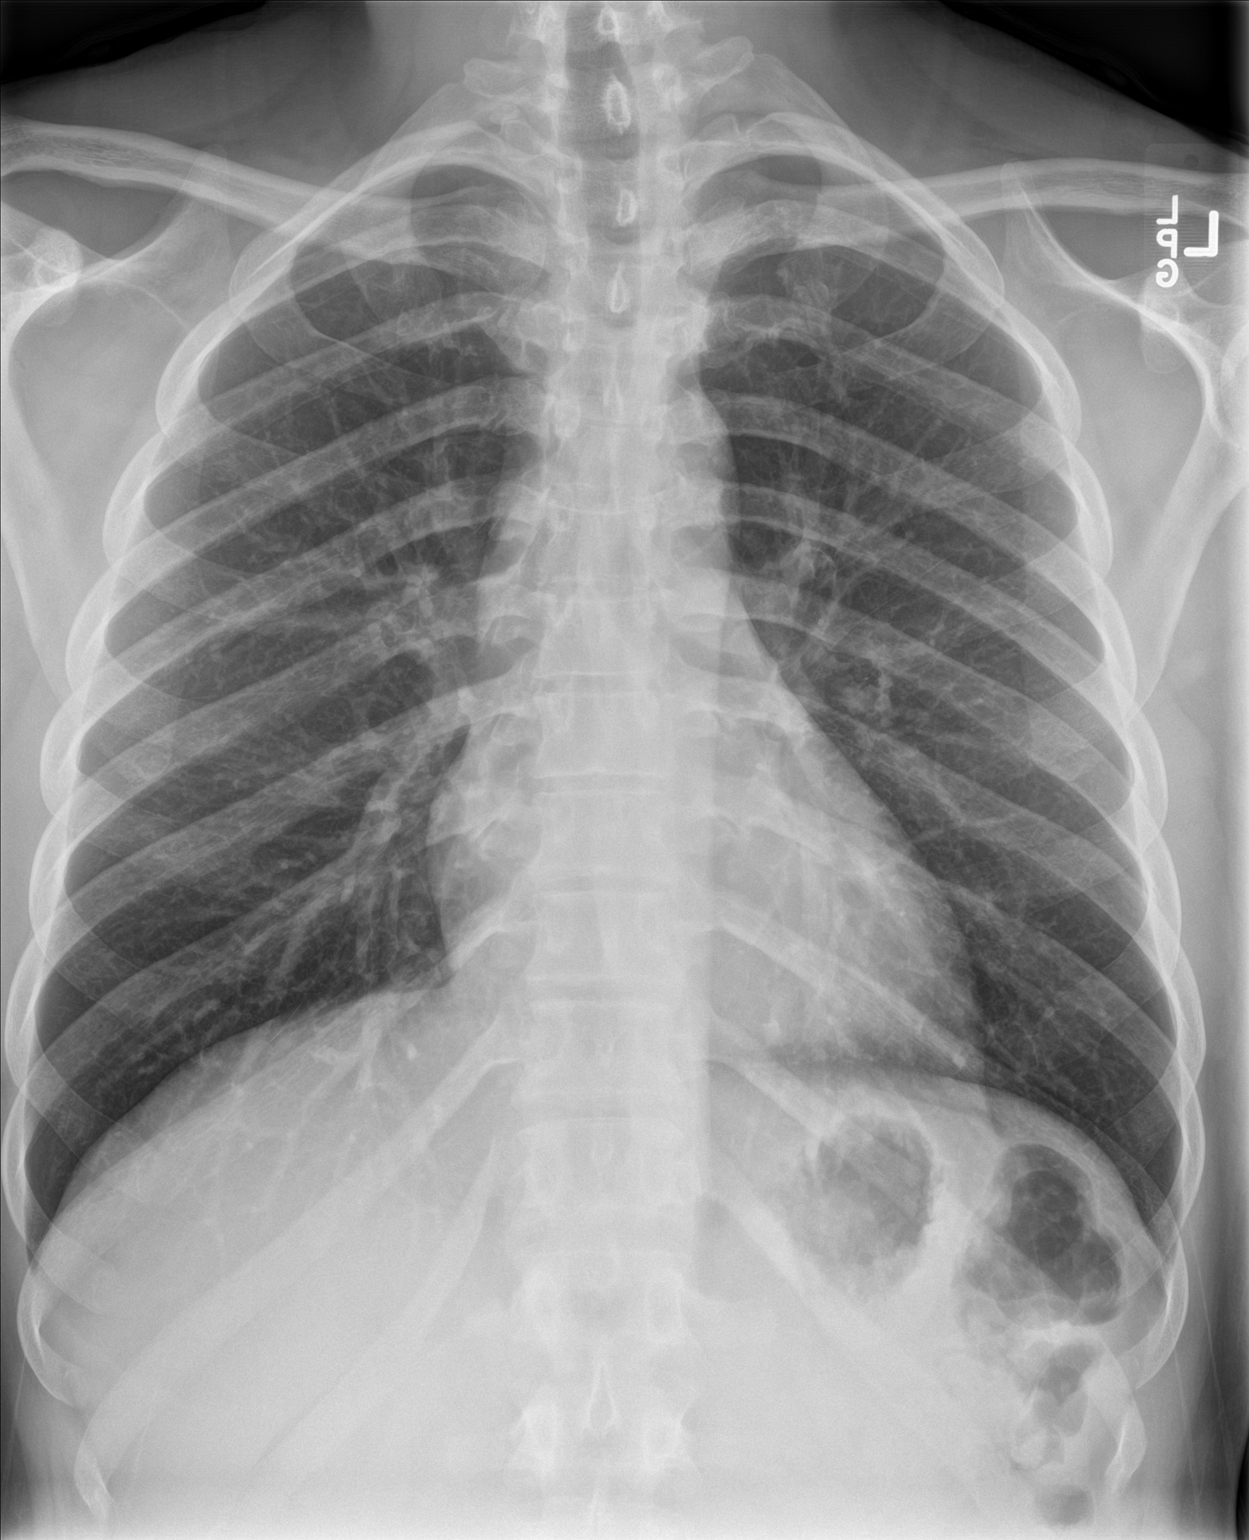

[1 of 1 positions shown; findings below may reference images not displayed]

FINDINGS: Lungs are clear. Heart size and pulmonary vascularity are normal. No
adenopathy. No bone lesions.
IMPRESSION: No abnormality noted.

## 2022-06-04 ENCOUNTER — Ambulatory Visit (INDEPENDENT_AMBULATORY_CARE_PROVIDER_SITE_OTHER): Payer: Commercial Managed Care - HMO | Admitting: Orthopedic Surgery

## 2022-06-04 DIAGNOSIS — M654 Radial styloid tenosynovitis [de Quervain]: Secondary | ICD-10-CM

## 2022-06-04 NOTE — Progress Notes (Signed)
Office Visit Note   Patient: Maurice Welch           Date of Birth: 12/13/1985           MRN: [id number] Visit Date: 06/04/2022              Requested by: No referring provider defined for this encounter. PCP: Patient, No Pcp Per   Assessment & Plan: Visit Diagnoses:  1. De Quervain's tenosynovitis, right     Plan: Patient continues to have pain directly over the radial styloid w/ positive Finkelstein test and significant focal swelling.  He got ~2 months of symptom relief with the previous corticosteroid injection.  We again reviewed the nature of De Quervain's tenosynovitis and both conservative and surgical treatment options.  We reviewed the risks of surgery including bleeding, infection, damage to neurovascular structures, incomplete symptom relief, need for additional surgery.  Patient would like to proceed with open first dorsal compartment release rather than repeat injection.  A surgical date and time will be confirmed with the patient.   Follow-Up Instructions: No follow-ups on file.   Orders:  No orders of the defined types were placed in this encounter.  No orders of the defined types were placed in this encounter.     Procedures: No procedures performed   Clinical Data: No additional findings.   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  This started in March of this year.  He underwent corticosteroid injection into the right first dorsal compartment at the end of March.  He had around 2 months of significant symptom relief.  His pain has returned.  It is quite bothersome for him and is interfering with his daily activities.    Review of Systems   Objective: Vital Signs: There were no vitals taken for this visit.  Physical Exam Constitutional:      Appearance: Normal appearance.   Cardiovascular:     Rate and Rhythm: Normal rate.     Pulses: Normal pulses.  Pulmonary:     Effort: Pulmonary effort is normal.  Skin:    General: Skin is warm.     Capillary Refill: Capillary refill takes less than 2 seconds.  Neurological:     Mental Status: He is alert.     Right Hand Exam   Tenderness  Right hand tenderness location: TTP over radial styloid w/ moderate swelling.  Tests  Finkelstein's test: positive  Other  Erythema: absent Sensation: normal Pulse: present      Specialty Comments:  No specialty comments available.  Imaging: No results found.   PMFS History: Patient Active Problem List   Diagnosis Date Noted   De Quervain's tenosynovitis, right 01/28/2022   No past medical history on file.  No family history on file.  No past surgical history on file. Social History   Occupational History   Not on file  Tobacco Use   Smoking status: Every Day    Packs/day: 0.20    Years: 10.00    Total pack years: 2.00    Types: Cigars, Cigarettes   Smokeless tobacco: Never  Vaping Use   Vaping Use: Never used  Substance and Sexual Activity   Alcohol use: Yes    Comment: occasionally   Drug use: No   Sexual activity: Yes        

## 2022-06-04 NOTE — H&P (View-Only) (Signed)
Office Visit Note   Patient: Maurice Welch           Date of Birth: 04-09-1986           MRN: [id number] Visit Date: 06/04/2022              Requested by: No referring provider defined for this encounter. PCP: Patient, No Pcp Per   Assessment & Plan: Visit Diagnoses:  1. De Quervain's tenosynovitis, right     Plan: Patient continues to have pain directly over the radial styloid w/ positive Finkelstein test and significant focal swelling.  He got ~2 months of symptom relief with the previous corticosteroid injection.  We again reviewed the nature of De Quervain's tenosynovitis and both conservative and surgical treatment options.  We reviewed the risks of surgery including bleeding, infection, damage to neurovascular structures, incomplete symptom relief, need for additional surgery.  Patient would like to proceed with open first dorsal compartment release rather than repeat injection.  A surgical date and time will be confirmed with the patient.   Follow-Up Instructions: No follow-ups on file.   Orders:  No orders of the defined types were placed in this encounter.  No orders of the defined types were placed in this encounter.     Procedures: No procedures performed   Clinical Data: No additional findings.   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  This started in March of this year.  He underwent corticosteroid injection into the right first dorsal compartment at the end of March.  He had around 2 months of significant symptom relief.  His pain has returned.  It is quite bothersome for him and is interfering with his daily activities.    Review of Systems   Objective: Vital Signs: There were no vitals taken for this visit.  Physical Exam Constitutional:      Appearance: Normal appearance.   Cardiovascular:     Rate and Rhythm: Normal rate.     Pulses: Normal pulses.  Pulmonary:     Effort: Pulmonary effort is normal.  Skin:    General: Skin is warm.     Capillary Refill: Capillary refill takes less than 2 seconds.  Neurological:     Mental Status: He is alert.     Right Hand Exam   Tenderness  Right hand tenderness location: TTP over radial styloid w/ moderate swelling.  Tests  Finkelstein's test: positive  Other  Erythema: absent Sensation: normal Pulse: present      Specialty Comments:  No specialty comments available.  Imaging: No results found.   PMFS History: Patient Active Problem List   Diagnosis Date Noted   De Quervain's tenosynovitis, right 01/28/2022   No past medical history on file.  No family history on file.  No past surgical history on file. Social History   Occupational History   Not on file  Tobacco Use   Smoking status: Every Day    Packs/day: 0.20    Years: 10.00    Total pack years: 2.00    Types: Cigars, Cigarettes   Smokeless tobacco: Never  Vaping Use   Vaping Use: Never used  Substance and Sexual Activity   Alcohol use: Yes    Comment: occasionally   Drug use: No   Sexual activity: Yes

## 2022-06-10 ENCOUNTER — Encounter (HOSPITAL_BASED_OUTPATIENT_CLINIC_OR_DEPARTMENT_OTHER): Payer: Self-pay | Admitting: Orthopedic Surgery

## 2022-06-10 ENCOUNTER — Other Ambulatory Visit: Payer: Self-pay

## 2022-06-14 ENCOUNTER — Ambulatory Visit (HOSPITAL_BASED_OUTPATIENT_CLINIC_OR_DEPARTMENT_OTHER): Payer: Commercial Managed Care - HMO | Admitting: Anesthesiology

## 2022-06-14 ENCOUNTER — Other Ambulatory Visit: Payer: Self-pay

## 2022-06-14 ENCOUNTER — Encounter (HOSPITAL_BASED_OUTPATIENT_CLINIC_OR_DEPARTMENT_OTHER)
Admission: RE | Disposition: A | Discharge status: Home / Self Care | Payer: Self-pay | Source: Home / Self Care | Attending: Orthopedic Surgery

## 2022-06-14 ENCOUNTER — Encounter (HOSPITAL_BASED_OUTPATIENT_CLINIC_OR_DEPARTMENT_OTHER): Payer: Self-pay | Admitting: Orthopedic Surgery

## 2022-06-14 ENCOUNTER — Ambulatory Visit (HOSPITAL_BASED_OUTPATIENT_CLINIC_OR_DEPARTMENT_OTHER)
Admission: RE | Admit: 2022-06-14 | Discharge: 2022-06-14 | Disposition: A | Discharge status: Home / Self Care | Payer: Commercial Managed Care - HMO | Attending: Orthopedic Surgery | Admitting: Orthopedic Surgery

## 2022-06-14 DIAGNOSIS — M654 Radial styloid tenosynovitis [de Quervain]: Secondary | ICD-10-CM | POA: Insufficient documentation

## 2022-06-14 DIAGNOSIS — F17200 Nicotine dependence, unspecified, uncomplicated: Secondary | ICD-10-CM

## 2022-06-14 HISTORY — PX: DORSAL COMPARTMENT RELEASE: SHX5039

## 2022-06-14 SURGERY — RELEASE, FIRST DORSAL COMPARTMENT, HAND
Anesthesia: Monitor Anesthesia Care | Site: Wrist | Laterality: Right

## 2022-06-14 MED ORDER — FENTANYL CITRATE (PF) 100 MCG/2ML IJ SOLN: 25.0000 ug | INTRAMUSCULAR | Status: DC | PRN

## 2022-06-14 MED ORDER — MIDAZOLAM HCL 2 MG/2ML IJ SOLN
INTRAMUSCULAR | Status: DC | PRN
  Administered 2022-06-14: 2 mg via INTRAVENOUS

## 2022-06-14 MED ORDER — PROPOFOL 500 MG/50ML IV EMUL
INTRAVENOUS | Status: DC | PRN
  Administered 2022-06-14: 100 ug/kg/min via INTRAVENOUS

## 2022-06-14 MED ORDER — ONDANSETRON HCL 4 MG/2ML IJ SOLN
INTRAMUSCULAR | Status: DC | PRN
  Administered 2022-06-14: 4 mg via INTRAVENOUS

## 2022-06-14 MED ORDER — FENTANYL CITRATE (PF) 100 MCG/2ML IJ SOLN
INTRAMUSCULAR | Status: DC | PRN
Start: 2022-06-14 — End: 2022-06-14
  Administered 2022-06-14: 50 ug via INTRAVENOUS

## 2022-06-14 MED ORDER — MIDAZOLAM HCL 2 MG/2ML IJ SOLN
INTRAMUSCULAR | Status: AC
  Filled 2022-06-14: qty 2

## 2022-06-14 MED ORDER — LACTATED RINGERS IV SOLN: INTRAVENOUS | Status: DC

## 2022-06-14 MED ORDER — BUPIVACAINE HCL (PF) 0.25 % IJ SOLN
INTRAMUSCULAR | Status: DC | PRN
  Administered 2022-06-14: 4 mL

## 2022-06-14 MED ORDER — PROPOFOL 10 MG/ML IV BOLUS
INTRAVENOUS | Status: DC | PRN
  Administered 2022-06-14: 20 mg via INTRAVENOUS

## 2022-06-14 MED ORDER — PROPOFOL 500 MG/50ML IV EMUL
INTRAVENOUS | Status: AC
  Filled 2022-06-14: qty 50

## 2022-06-14 MED ORDER — FENTANYL CITRATE (PF) 100 MCG/2ML IJ SOLN
INTRAMUSCULAR | Status: AC
  Filled 2022-06-14: qty 2

## 2022-06-14 MED ORDER — ONDANSETRON HCL 4 MG/2ML IJ SOLN
INTRAMUSCULAR | Status: AC
  Filled 2022-06-14: qty 2

## 2022-06-14 SURGICAL SUPPLY — 42 items
APL PRP STRL LF DISP 70% ISPRP (MISCELLANEOUS) ×1
BLADE SURG 15 STRL LF DISP TIS (BLADE) ×1 IMPLANT
BLADE SURG 15 STRL SS (BLADE) ×2
BNDG CMPR 9X4 STRL LF SNTH (GAUZE/BANDAGES/DRESSINGS)
BNDG COHESIVE 1X5 TAN STRL LF (GAUZE/BANDAGES/DRESSINGS) IMPLANT
BNDG ELASTIC 3X5.8 VLCR STR LF (GAUZE/BANDAGES/DRESSINGS) IMPLANT
BNDG ESMARK 4X9 LF (GAUZE/BANDAGES/DRESSINGS) IMPLANT
BNDG GAUZE DERMACEA FLUFF (GAUZE/BANDAGES/DRESSINGS)
BNDG GAUZE DERMACEA FLUFF 4 (GAUZE/BANDAGES/DRESSINGS) IMPLANT
BNDG GZE DERMACEA 4 6PLY (GAUZE/BANDAGES/DRESSINGS)
CHLORAPREP W/TINT 26 (MISCELLANEOUS) ×2 IMPLANT
CORD BIPOLAR FORCEPS 12FT (ELECTRODE) ×2 IMPLANT
COVER BACK TABLE 60X90IN (DRAPES) ×2 IMPLANT
COVER MAYO STAND STRL (DRAPES) ×2 IMPLANT
CUFF TOURN SGL QUICK 18X4 (TOURNIQUET CUFF) ×2 IMPLANT
CUFF TOURN SGL QUICK 24 (TOURNIQUET CUFF)
CUFF TRNQT CYL 24X4X16.5-23 (TOURNIQUET CUFF) IMPLANT
DRAPE EXTREMITY T 121X128X90 (DISPOSABLE) ×2 IMPLANT
DRAPE SURG 17X23 STRL (DRAPES) ×2 IMPLANT
GAUZE SPONGE 4X4 12PLY STRL (GAUZE/BANDAGES/DRESSINGS) IMPLANT
GAUZE XEROFORM 1X8 LF (GAUZE/BANDAGES/DRESSINGS) ×2 IMPLANT
GLOVE BIO SURGEON STRL SZ7 (GLOVE) ×2 IMPLANT
GLOVE BIOGEL PI IND STRL 7.0 (GLOVE) ×1 IMPLANT
GLOVE BIOGEL PI INDICATOR 7.0 (GLOVE) ×1
GOWN STRL REUS W/ TWL LRG LVL3 (GOWN DISPOSABLE) ×1 IMPLANT
GOWN STRL REUS W/ TWL XL LVL3 (GOWN DISPOSABLE) ×1 IMPLANT
GOWN STRL REUS W/TWL LRG LVL3 (GOWN DISPOSABLE) ×2
GOWN STRL REUS W/TWL XL LVL3 (GOWN DISPOSABLE) ×2
NDL HYPO 25X1 1.5 SAFETY (NEEDLE) IMPLANT
NEEDLE HYPO 25X1 1.5 SAFETY (NEEDLE) IMPLANT
NS IRRIG 1000ML POUR BTL (IV SOLUTION) ×2 IMPLANT
PACK BASIN DAY SURGERY FS (CUSTOM PROCEDURE TRAY) ×2 IMPLANT
PAD CAST 3X4 CTTN HI CHSV (CAST SUPPLIES) IMPLANT
PADDING CAST COTTON 3X4 STRL (CAST SUPPLIES)
SLEEVE SCD COMPRESS KNEE MED (STOCKING) IMPLANT
SUT ETHILON 4 0 PS 2 18 (SUTURE) ×2 IMPLANT
SUT MNCRL AB 3-0 PS2 18 (SUTURE) IMPLANT
SUT VICRYL 4-0 PS2 18IN ABS (SUTURE) IMPLANT
SYR BULB EAR ULCER 3OZ GRN STR (SYRINGE) ×2 IMPLANT
SYR CONTROL 10ML LL (SYRINGE) IMPLANT
TOWEL GREEN STERILE FF (TOWEL DISPOSABLE) ×4 IMPLANT
UNDERPAD 30X36 HEAVY ABSORB (UNDERPADS AND DIAPERS) ×2 IMPLANT

## 2022-06-14 NOTE — Transfer of Care (Signed)
Immediate Anesthesia Transfer of Care Note  Patient: Maurice Welch  Procedure(s) Performed: RIGHT RELEASE FIRST DORSAL COMPARTMENT (DEQUERVAIN) (Right: Wrist)  Patient Location: PACU  Anesthesia Type:MAC  Level of Consciousness: drowsy  Airway & Oxygen Therapy: Patient Spontanous Breathing and Patient connected to face mask oxygen  Post-op Assessment: Report given to RN and Post -op Vital signs reviewed and stable  Post vital signs: Reviewed and stable  Last Vitals:  Vitals Value Taken Time  BP 105/69 06/14/22 1404  Temp    Pulse 49 06/14/22 1405  Resp 12 06/14/22 1405  SpO2 100 % 06/14/22 1405  Vitals shown include unvalidated device data.  Last Pain:  Vitals:   06/14/22 1116  TempSrc: Oral  PainSc: 8       Patients Stated Pain Goal: 8 (33/54/56 2563)  Complications: No notable events documented.

## 2022-06-14 NOTE — Discharge Instructions (Addendum)
? ?Charles Benfield, M.D. ?Hand Surgery ? ?POST-OPERATIVE DISCHARGE INSTRUCTIONS ? ? ?PRESCRIPTIONS: ?- You have been given a prescription to be taken as directed for post-operative pain control.  You may also take over the counter ibuprofen/aleve and tylenol for pain. Take this as directed on the packaging. Do not exceed 3000 mg tylenol/acetaminophen in 24 hours. ? ?Ibuprofen 600-800 mg (3-4) tablets by mouth every 6 hours as needed for pain.  ? ?OR ? ?Aleve 2 tablets by mouth every 12 hours (twice daily) as needed for pain. ?  ?AND/OR ? ?Tylenol 1000 mg (2 tablets) every 8 hours as needed for pain. ? ?- Please use your pain medication carefully, as refills are limited and you may not be provided with one.  As stated above, please use over the counter pain medicine - it will also be helpful with decreasing your swelling.  ? ? ?ANESTHESIA: ?-After your surgery, post-surgical discomfort or pain is likely. This discomfort can last several days to a few weeks. At certain times of the day your discomfort may be more intense.  ? ?Did you receive a nerve block?  ? ?- A nerve block can provide pain relief for one hour to two days after your surgery. As long as the nerve block is working, you will experience little or no sensation in the area the surgeon operated on.  ?- As the nerve block wears off, you will begin to experience pain or discomfort. It is very important that you begin taking your prescribed pain medication before the nerve block fully wears off. Treating your pain at the first sign of the block wearing off will ensure your pain is better controlled and more tolerable when full-sensation returns. Do not wait until the pain is intolerable, as the medicine will be less effective. It is better to treat pain in advance than to try and catch up.  ? ?General Anesthesia:  ?If you did not receive a nerve block during your surgery, you will need to start taking your pain medication shortly after your surgery and  should continue to do so as prescribed by your surgeon.   ? ? ?ICE AND ELEVATION: ?- You may use ice for the first 48-72 hours, but it is not critical.   ?- Motion of your fingers is very important to decrease the swelling.  ?- Elevation, as much as possible for the next 48 hours, is critical for decreasing swelling as well as for pain relief. Elevation means when you are seated or lying down, you hand should be at or above your heart. When walking, the hand needs to be at or above the level of your elbow.  ?- If the bandage gets too tight, it may need to be loosened. Please contact our office and we will instruct you in how to do this.  ? ? ?SURGICAL BANDAGES:  ?- Keep your dressing and/or splint clean and dry at all times.  You can remove your dressing 4 days from now and change with a dry dressing or Band-Aids as needed thereafter. ?- You may place a plastic bag over your bandage to shower, but be careful, do not get your bandages wet.  ?- After the bandages have been removed, it is OK to get the stitches wet in a shower or with hand washing. Do Not soak or submerge the wound yet. Please do not use lotions or creams on the stitches.   ?  ? ?HAND THERAPY:  ?- You may not need any. If you do,   we will begin this at your follow up visit in the clinic.  ? ? ?ACTIVITY AND WORK: ?- You are encouraged to move any fingers which are not in the bandage.  ?- Light use of the fingers is allowed to assist the other hand with daily hygiene and eating, but strong gripping or lifting is often uncomfortable and should be avoided.  ?- You might miss a variable period of time from work and hopefully this issue has been discussed prior to surgery. You may not do any heavy work with your affected hand for about 2 weeks.  ? ? ?[facility] ?[address] ?[place],  [place]  [address] ?[phone]  ? ? ?Post Anesthesia Home Care Instructions ? ?Activity: ?Get plenty of rest for the remainder of the day. A  responsible individual must stay with you for 24 hours following the procedure.  ?For the next 24 hours, DO NOT: ?-Drive a car ?-Operate machinery ?-Drink alcoholic beverages ?-Take any medication unless instructed by your physician ?-Make any legal decisions or sign important papers. ? ?Meals: ?Start with liquid foods such as gelatin or soup. Progress to regular foods as tolerated. Avoid greasy, spicy, heavy foods. If nausea and/or vomiting occur, drink only clear liquids until the nausea and/or vomiting subsides. Call your physician if vomiting continues. ? ?Special Instructions/Symptoms: ?Your throat may feel dry or sore from the anesthesia or the breathing tube placed in your throat during surgery. If this causes discomfort, gargle with warm salt water. The discomfort should disappear within 24 hours. ? ? ?

## 2022-06-14 NOTE — Op Note (Signed)
   Date of Surgery: 06/14/2022  INDICATIONS: Patient is a 36 y.o.-year-old male with right De Quervain's tenosynovitis that has failed conservative management including bracing and corticosteroid injections .  Risks, benefits, and alternatives to surgery were again discussed with the patient in the preoperative area. The patient wishes to proceed with surgery.  Informed consent was signed after our discussion.   PREOPERATIVE DIAGNOSIS:  Right De Quervain's tenosynovitis  POSTOPERATIVE DIAGNOSIS: Same.  PROCEDURE:  Right first dorsal compartment release   SURGEON: Audria Nine, M.D.  ASSIST:   ANESTHESIA:  Local, MAC  IV FLUIDS AND URINE: See anesthesia.  ESTIMATED BLOOD LOSS: <5 mL.  IMPLANTS: * No implants in log *   DRAINS: None  COMPLICATIONS: None  DESCRIPTION OF PROCEDURE: The patient was met in the preoperative holding area where the surgical site was marked and the consent form was verified.  The patient was then taken to the operating room and transferred to the operating table.  All bony prominences were well padded.  A tourniquet was applied to the right forearm.  Monitored sedation was induced.  A formal time-out was performed to confirm that this was the correct patient, surgery, side, and site. A local block was performed using 5cc of 0.25% plain marcaine.  The operative extremity was prepped and draped in the usual and sterile fashion.    Following a second formal timeout, a transverse incision was made just proximal to the tip of the radial styloid.  The skin only was incised.  Blunt dissection was used longitudinally to identify and protect the cutaneous nerve branches.  Further blunt dissection was used to identify the first dorsal compartment.  Senn retractors were placed both radially and ulnarly to retract and protect the neurovascular structures.  The first dorsal compartment was entered on its dorsal margin to prevent volar subluxation of the tendons after  release.  A another blunt retractor was placed proximally and the first dorsal compartment was released in a proximal direction.  The retractor was then placed in the distal aspect of the wound and the compartment was similarly released distally.  There was a separate subsheath for the EPB tendon which was identified and released.  There was some inflamed synovial tissues around the tendons which was sharply debrided.  Following complete release, the wound was thoroughly irrigated with copious sterile saline.  The tourniquet was let down.  The hand was warm, pink, and well-perfused.  Hemostasis was achieved with direct pressure of the wound.  The wound was then closed using a 4-0 nylon suture in horizontal mattress fashion.  The wound was dressed with Xeroform, folded Kerlix, and an Ace wrap.  Patient was then reversed from anesthesia and transferred to the postoperative bed.  All counts were correct at the end the procedure.  The patient was then taken to PACU in stable condition.   POSTOPERATIVE PLAN: He will be discharged home with appropriate pain medication and discharge instructions.  I will see him back in the office in 10 to 14 days for his first postop visit.  Audria Nine, MD 2:04 PM

## 2022-06-14 NOTE — Interval H&P Note (Signed)
History and Physical Interval Note:  06/14/2022 12:27 PM  Maurice Welch  has presented today for surgery, with the diagnosis of RIGHT DE QUERVAIN'S TENOSYNOVITIS.  The various methods of treatment have been discussed with the patient and family. After consideration of risks, benefits and other options for treatment, the patient has consented to  Procedure(s): RIGHT RELEASE FIRST DORSAL COMPARTMENT (DEQUERVAIN) (Right) as a surgical intervention.  The patient's history has been reviewed, patient examined, no change in status, stable for surgery.  I have reviewed the patient's chart and labs.  Questions were answered to the patient's satisfaction.     [employer] [name]

## 2022-06-14 NOTE — Anesthesia Postprocedure Evaluation (Signed)
Anesthesia Post Note  Patient: Maurice Welch  Procedure(s) Performed: RIGHT RELEASE FIRST DORSAL COMPARTMENT (DEQUERVAIN) (Right: Wrist)     Patient location during evaluation: PACU Anesthesia Type: MAC Level of consciousness: awake and alert Pain management: pain level controlled Vital Signs Assessment: post-procedure vital signs reviewed and stable Respiratory status: spontaneous breathing, nonlabored ventilation, respiratory function stable and patient connected to nasal cannula oxygen Cardiovascular status: stable and blood pressure returned to baseline Postop Assessment: no apparent nausea or vomiting Anesthetic complications: no   No notable events documented.  Last Vitals:  Vitals:   06/14/22 1415 06/14/22 1500  BP: 115/81 139/88  Pulse: (!) 44 (!) 55  Resp: 12 16  Temp:  (!) 36.2 C  SpO2: 100% 97%    Last Pain:  Vitals:   06/14/22 1500  TempSrc:   PainSc: 0-No pain                 [name]

## 2022-06-14 NOTE — Anesthesia Preprocedure Evaluation (Signed)
Anesthesia Evaluation  Patient identified by MRN, date of birth, ID band Patient awake    Reviewed: Allergy & Precautions, NPO status , Patient's Chart, lab work & pertinent test results  Airway Mallampati: II  TM Distance: >3 FB Neck ROM: Full    Dental no notable dental hx.    Pulmonary neg pulmonary ROS, Current SmokerPatient did not abstain from smoking.,    Pulmonary exam normal        Cardiovascular negative cardio ROS   Rhythm:Regular Rate:Normal     Neuro/Psych negative neurological ROS  negative psych ROS   GI/Hepatic negative GI ROS, Neg liver ROS,   Endo/Other  negative endocrine ROS  Renal/GU negative Renal ROS  negative genitourinary   Musculoskeletal De Quervain's   Abdominal Normal abdominal exam  (+)   Peds  Hematology negative hematology ROS (+)   Anesthesia Other Findings   Reproductive/Obstetrics                             Anesthesia Physical Anesthesia Plan  ASA: 2  Anesthesia Plan: MAC   Post-op Pain Management:    Induction: Intravenous  PONV Risk Score and Plan: Ondansetron, Dexamethasone, Midazolam, Propofol infusion and Treatment may vary due to age or medical condition  Airway Management Planned: Simple Face Mask, Natural Airway and Nasal Cannula  Additional Equipment: None  Intra-op Plan:   Post-operative Plan:   Informed Consent: I have reviewed the patients History and Physical, chart, labs and discussed the procedure including the risks, benefits and alternatives for the proposed anesthesia with the patient or authorized representative who has indicated his/her understanding and acceptance.     Dental advisory given  Plan Discussed with:   Anesthesia Plan Comments:         Anesthesia Quick Evaluation

## 2022-06-15 ENCOUNTER — Encounter (HOSPITAL_BASED_OUTPATIENT_CLINIC_OR_DEPARTMENT_OTHER): Payer: Self-pay | Admitting: Orthopedic Surgery

## 2022-06-18 ENCOUNTER — Telehealth: Payer: Self-pay | Admitting: Orthopedic Surgery

## 2022-06-18 NOTE — Telephone Encounter (Signed)
[employer] forms received. To [employer]. 

## 2022-06-24 ENCOUNTER — Ambulatory Visit (INDEPENDENT_AMBULATORY_CARE_PROVIDER_SITE_OTHER): Payer: 59 | Admitting: Orthopedic Surgery

## 2022-06-24 ENCOUNTER — Encounter: Payer: Self-pay | Admitting: Orthopedic Surgery

## 2022-06-24 DIAGNOSIS — M654 Radial styloid tenosynovitis [de Quervain]: Secondary | ICD-10-CM

## 2022-06-24 NOTE — Progress Notes (Signed)
   Post-Op Visit Note   Patient: Maurice Welch           Date of Birth: 10-08-1986           MRN: [id number] Visit Date: 06/24/2022 PCP: Patient, No Pcp Per   Assessment & Plan:  Chief Complaint:  Chief Complaint  Patient presents with   Right Hand - Routine Post Op   Visit Diagnoses:  1. De Quervain's tenosynovitis, right     Plan: Patient is 10 days out from right first dorsal compartment release.  He is still moderately swollen but seems to be improved from before surgery.  His incision is clean, dry, and well approximated.  He denies any numbness around the incision or the thumb.  He can see me back in a few weeks if he remains symptomatic.   Follow-Up Instructions: No follow-ups on file.   Orders:  No orders of the defined types were placed in this encounter.  No orders of the defined types were placed in this encounter.   Imaging: No results found.  PMFS History: Patient Active Problem List   Diagnosis Date Noted   De Quervain's tenosynovitis, right 01/28/2022   Past Medical History:  Diagnosis Date   De Quervain's tenosynovitis     History reviewed. No pertinent family history.  Past Surgical History:  Procedure Laterality Date   DORSAL COMPARTMENT RELEASE Right 06/14/2022   Procedure: RIGHT RELEASE FIRST DORSAL COMPARTMENT (DEQUERVAIN);  Surgeon: Marlyne Beards, MD;  Location: [facility];  Service: Orthopedics;  Laterality: Right;   ORIF WRIST FRACTURE  11/02/2007   Social History   Occupational History   Not on file  Tobacco Use   Smoking status: Every Day    Types: Cigars   Smokeless tobacco: Never  Vaping Use   Vaping Use: Never used  Substance and Sexual Activity   Alcohol use: Yes    Comment: occasionally   Drug use: No   Sexual activity: Yes

## 2022-09-29 ENCOUNTER — Emergency Department (HOSPITAL_COMMUNITY)
Admission: EM | Admit: 2022-09-29 | Discharge: 2022-09-29 | Disposition: A | Discharge status: Home / Self Care | Payer: Commercial Managed Care - HMO | Attending: Emergency Medicine | Admitting: Emergency Medicine

## 2022-09-29 ENCOUNTER — Other Ambulatory Visit: Payer: Self-pay

## 2022-09-29 DIAGNOSIS — L03317 Cellulitis of buttock: Secondary | ICD-10-CM | POA: Diagnosis not present

## 2022-09-29 DIAGNOSIS — L0390 Cellulitis, unspecified: Secondary | ICD-10-CM

## 2022-09-29 DIAGNOSIS — M7602 Gluteal tendinitis, left hip: Secondary | ICD-10-CM | POA: Diagnosis present

## 2022-09-29 MED ORDER — CEPHALEXIN 250 MG PO CAPS
500.0000 mg | ORAL_CAPSULE | Freq: Once | ORAL | Status: AC
  Administered 2022-09-29: 500 mg via ORAL
  Filled 2022-09-29: qty 2

## 2022-09-29 MED ORDER — CEPHALEXIN 500 MG PO CAPS: 500.0000 mg | ORAL_CAPSULE | Freq: Two times a day (BID) | ORAL | 0 refills | Status: AC

## 2022-09-29 NOTE — ED Provider Notes (Signed)
[facility] EMERGENCY DEPARTMENT Provider Note   CSN: [id number] Arrival date & time: 09/29/22  0013     History Chief Complaint  Patient presents with   Wound Check    HPI Maurice Welch is a 36 y.o. male presenting for lesion on his left buttocks.  He states that he had a wound there last week that he poked with a needle.  Only scant drainage.  Is now become inflamed and warm to the touch.  He denies fevers or chills with nausea vomiting, syncope shortness of breath.  He is ambulatory tolerating p.o. intake.  No history of MRSA infections.  No recent antibiotics..   Patient's recorded medical, surgical, social, medication list and allergies were reviewed in the Snapshot window as part of the initial history.   Review of Systems   Review of Systems  Constitutional:  Negative for chills and fever.  HENT:  Negative for ear pain and sore throat.   Eyes:  Negative for pain and visual disturbance.  Respiratory:  Negative for cough and shortness of breath.   Cardiovascular:  Negative for chest pain and palpitations.  Gastrointestinal:  Negative for abdominal pain and vomiting.  Genitourinary:  Negative for dysuria and hematuria.  Musculoskeletal:  Negative for arthralgias and back pain.  Skin:  Positive for wound. Negative for color change and rash.  Neurological:  Negative for seizures and syncope.  All other systems reviewed and are negative.   Physical Exam Updated Vital Signs BP 105/60 (BP Location: Right Arm)   Pulse 80   Temp 98.6 F (37 C) (Oral)   Resp 16   Ht 6\' 1"  (1.854 m)   Wt 76 kg   SpO2 100%   BMI 22.11 kg/m  Physical Exam Vitals and nursing note reviewed.  Constitutional:      General: He is not in acute distress.    Appearance: He is well-developed.  HENT:     Head: Normocephalic and atraumatic.  Eyes:     Conjunctiva/sclera: Conjunctivae normal.  Cardiovascular:     Rate and Rhythm: Normal rate and regular rhythm.     Heart  sounds: No murmur heard. Pulmonary:     Effort: Pulmonary effort is normal. No respiratory distress.     Breath sounds: Normal breath sounds.  Abdominal:     Palpations: Abdomen is soft.     Tenderness: There is no abdominal tenderness.  Musculoskeletal:        General: No swelling.     Cervical back: Neck supple.  Skin:    General: Skin is warm and dry.     Capillary Refill: Capillary refill takes less than 2 seconds.     Findings: Lesion (2 cm circumscribed lesion to his left buttocks.) present.  Neurological:     Mental Status: He is alert.  Psychiatric:        Mood and Affect: Mood normal.      ED Course/ Medical Decision Making/ A&P    Procedures Ultrasound ED Soft Tissue  Date/Time: 09/29/2022 8:20 AM  Performed by: Tretha Sciara, MD Authorized by: Tretha Sciara, MD   Procedure details:    Indications: evaluate for cellulitis     Transverse view:  Visualized   Longitudinal view:  Visualized   Images: archived   Location:    Location: buttocks     Side:  Left Findings:     no abscess present    cellulitis present    Medications Ordered in ED Medications  cephALEXin (KEFLEX)  capsule 500 mg (has no administration in time range)    Medical Decision Making:    CAROLYN MANISCALCO is a 36 y.o. male who presented to the ED today with soft tissue skin infection of the left buttocks detailed above.     Complete initial physical exam performed, notably the patient  was HDS in NAD.      Reviewed and confirmed nursing documentation for past medical history, family history, social history.    Initial Assessment:   With the patient's presentation of skin lesion, most likely diagnosis is cellulitis. Other diagnoses were considered including (but not limited to) abscess. These are considered less likely due to history of present illness and physical exam findings.   This is most consistent with an acute life/limb threatening illness complicated by underlying  chronic conditions.  Initial Plan:  Point-of-care ultrasound performed as above without evidence of drainable fluid collection.  Patient will be treated with Keflex due to lack of history of MRSA infection or recent exposure to antibiotics. Will treat patient with antibiotics for 5 days and have him follow-up with his PCP within 48 hours for reassessment to ensure ongoing improvement.   Disposition:  I have considered need for hospitalization, however, considering all of the above, I believe this patient is stable for discharge at this time.  Patient/family educated about specific return precautions for given chief complaint and symptoms.  Patient/family educated about follow-up with PCP.     Patient/family expressed understanding of return precautions and need for follow-up. Patient spoken to regarding all imaging and laboratory results and appropriate follow up for these results. All education provided in verbal form with additional information in written form. Time was allowed for answering of patient questions. Patient discharged.    Emergency Department Medication Summary:   Medications  cephALEXin (KEFLEX) capsule 500 mg (has no administration in time range)         Clinical Impression:  1. Cellulitis, unspecified cellulitis site      Discharge   Final Clinical Impression(s) / ED Diagnoses Final diagnoses:  Cellulitis, unspecified cellulitis site    Rx / DC Orders ED Discharge Orders          Ordered    cephALEXin (KEFLEX) 500 MG capsule  2 times daily        09/29/22 4098              Glyn Ade, MD 09/29/22 [phone]

## 2022-09-29 NOTE — ED Triage Notes (Signed)
Pt arrived complaining of knot on the left side of his tailbone. Pt denies trauma to area but states he noticed the area started to scab

## 2022-09-29 NOTE — ED Provider Triage Note (Signed)
Emergency Medicine Provider Triage Evaluation Note  Maurice Welch , a 36 y.o. male  was evaluated in triage.  Pt complains of abscess to left lateral buttock.  Started a few days ago, more painful and scabbed over today.  Denies bleeding/drainage.  No fevers.  No hx of DM.  Review of Systems  Positive: abscess Negative: fever  Physical Exam  BP 113/78 (BP Location: Right Arm)   Pulse 96   Temp 97.9 F (36.6 C)   Resp 18   SpO2 98%  Gen:   Awake, no distress   Resp:  Normal effort  MSK:   Moves extremities without difficulty  Other:  Small abscess noted to left lateral buttock, scab present without active drainage, locally tender  Medical Decision Making  Medically screening exam initiated at 12:40 AM.  Appropriate orders placed.  Maurice Welch was informed that the remainder of the evaluation will be completed by another provider, this initial triage assessment does not replace that evaluation, and the importance of remaining in the ED until their evaluation is complete.  Abscess left buttock.  May need I&D after further exam.   Maurice Hatchet, PA-C 09/29/22 0041
